# Patient Record
Sex: Female | Born: 1970 | Race: Black or African American | Hispanic: No | Marital: Married | Smoking: Never smoker
Health system: Southern US, Community
[De-identification: ages and names within clinical notes are randomized; demographics above are authoritative.]

## PROBLEM LIST (undated history)

## (undated) DIAGNOSIS — E119 Type 2 diabetes mellitus without complications: Secondary | ICD-10-CM

---

## 2016-08-16 ENCOUNTER — Emergency Department: Payer: Self-pay

## 2016-08-16 ENCOUNTER — Encounter: Payer: Self-pay | Admitting: *Deleted

## 2016-08-16 ENCOUNTER — Emergency Department
Admission: EM | Admit: 2016-08-16 | Discharge: 2016-08-16 | Disposition: A | Discharge status: Home / Self Care | Payer: Self-pay | Attending: Emergency Medicine | Admitting: Emergency Medicine

## 2016-08-16 DIAGNOSIS — E119 Type 2 diabetes mellitus without complications: Secondary | ICD-10-CM | POA: Insufficient documentation

## 2016-08-16 DIAGNOSIS — N1330 Unspecified hydronephrosis: Secondary | ICD-10-CM | POA: Insufficient documentation

## 2016-08-16 DIAGNOSIS — Z7984 Long term (current) use of oral hypoglycemic drugs: Secondary | ICD-10-CM | POA: Insufficient documentation

## 2016-08-16 DIAGNOSIS — N858 Other specified noninflammatory disorders of uterus: Secondary | ICD-10-CM | POA: Insufficient documentation

## 2016-08-16 HISTORY — DX: Type 2 diabetes mellitus without complications: E11.9

## 2016-08-16 LAB — POCT PREGNANCY, URINE: PREG TEST UR: NEGATIVE

## 2016-08-16 LAB — COMPREHENSIVE METABOLIC PANEL
ALBUMIN: 4.4 g/dL (ref 3.5–5.0)
ALT: 11 U/L — ABNORMAL LOW (ref 14–54)
ANION GAP: 12 (ref 5–15)
AST: 26 U/L (ref 15–41)
Alkaline Phosphatase: 68 U/L (ref 38–126)
BUN: 6 mg/dL (ref 6–20)
CHLORIDE: 104 mmol/L (ref 101–111)
CO2: 19 mmol/L — ABNORMAL LOW (ref 22–32)
Calcium: 9.1 mg/dL (ref 8.9–10.3)
Creatinine, Ser: 0.54 mg/dL (ref 0.44–1.00)
GFR calc Af Amer: >60 mL/min (ref >60)
GFR calc non Af Amer: >60 mL/min (ref >60)
GLUCOSE: 184 mg/dL — AB (ref 65–99)
POTASSIUM: 3.9 mmol/L (ref 3.5–5.1)
Sodium: 135 mmol/L (ref 135–145)
TOTAL PROTEIN: 8.1 g/dL (ref 6.5–8.1)
Total Bilirubin: 0.2 mg/dL — ABNORMAL LOW (ref 0.3–1.2)

## 2016-08-16 LAB — URINALYSIS, COMPLETE (UACMP) WITH MICROSCOPIC
BILIRUBIN URINE: NEGATIVE
Glucose, UA: 50 mg/dL — AB
Hgb urine dipstick: NEGATIVE
Ketones, ur: 20 mg/dL — AB
Leukocytes, UA: NEGATIVE
Nitrite: NEGATIVE
PROTEIN: 30 mg/dL — AB
Specific Gravity, Urine: 1.026 (ref 1.005–1.030)
pH: 5 (ref 5.0–8.0)

## 2016-08-16 LAB — CBC
HEMATOCRIT: 32.7 % — AB (ref 35.0–47.0)
HEMOGLOBIN: 10.3 g/dL — AB (ref 12.0–16.0)
MCH: 20.5 pg — AB (ref 26.0–34.0)
MCHC: 31.5 g/dL — ABNORMAL LOW (ref 32.0–36.0)
MCV: 65.1 fL — AB (ref 80.0–100.0)
PLATELETS: 299 10*3/uL (ref 150–440)
RBC: 5.02 MIL/uL (ref 3.80–5.20)
RDW: 20.4 % — ABNORMAL HIGH (ref 11.5–14.5)
WBC: 8.2 10*3/uL (ref 3.6–11.0)

## 2016-08-16 MED ORDER — MORPHINE SULFATE (PF) 4 MG/ML IV SOLN
4.0000 mg | Freq: Once | INTRAVENOUS | Status: AC
  Administered 2016-08-16: 4 mg via INTRAVENOUS
  Filled 2016-08-16: qty 1

## 2016-08-16 MED ORDER — ONDANSETRON HCL 4 MG PO TABS: 4.0000 mg | ORAL_TABLET | Freq: Three times a day (TID) | ORAL | 0 refills | Status: DC | PRN

## 2016-08-16 MED ORDER — ONDANSETRON HCL 4 MG/2ML IJ SOLN
4.0000 mg | Freq: Once | INTRAMUSCULAR | Status: AC
  Administered 2016-08-16: 4 mg via INTRAVENOUS
  Filled 2016-08-16: qty 2

## 2016-08-16 MED ORDER — IOPAMIDOL (ISOVUE-300) INJECTION 61%
75.0000 mL | Freq: Once | INTRAVENOUS | Status: AC | PRN
  Administered 2016-08-16: 75 mL via INTRAVENOUS

## 2016-08-16 MED ORDER — OXYCODONE-ACETAMINOPHEN 5-325 MG PO TABS: 1.0000 | ORAL_TABLET | Freq: Four times a day (QID) | ORAL | 0 refills | Status: AC | PRN

## 2016-08-16 MED ORDER — SODIUM CHLORIDE 0.9 % IV BOLUS (SEPSIS)
1000.0000 mL | Freq: Once | INTRAVENOUS | Status: AC
  Administered 2016-08-16: 1000 mL via INTRAVENOUS

## 2016-08-16 MED ORDER — IOPAMIDOL (ISOVUE-300) INJECTION 61%
30.0000 mL | Freq: Once | INTRAVENOUS | Status: AC
  Administered 2016-08-16: 30 mL via ORAL

## 2016-08-16 NOTE — ED Triage Notes (Signed)
Pt arrives via EMS from home with pain that starts in her thigh and goes up to her right abdomen, states hx of fibroid tumor, states she hasnt seen on obgyn in a year

## 2016-08-16 NOTE — ED Provider Notes (Signed)
[facility] Emergency Department Provider Note  ____________________________________________  Time seen: Approximately 3:03 PM  I have reviewed the triage vital signs and the nursing notes.   HISTORY  Chief Complaint Abdominal Pain   HPI Tricia Marks is a 46 y.o. female with a history of uterine fibroid who presents for evaluation of abdominal pain. Patient reports that she was told a few years ago that she had a large uterine fibroid. She hasn't been seen over a year due to lack of insurance. She has had intermittent left-sided pain which is consistent with her fibroid. She now comes to the emergency room for right-sided abdominal pain that she has had intermittently for a few months. She said she usually is able to take Tylenol at home and the pain gets better however today the pain was severe, sharp, located in the right lower quadrant radiating down to her thigh and constant. She took Tylenol with no relief of the pain. She reports that she had 1 episode of nonbloody nonbilious emesis with this pain. No vaginal discharge. LMP 2 weeks ago. She is status post tubal ligation. She has not had any other abdominal surgeries. No chest pain or shortness of breath, no dysuria or hematuria, no fever or chills.  Past Medical History:  Diagnosis Date  . Diabetes mellitus without complication (HCC)     There are no active problems to display for this patient.   History reviewed. No pertinent surgical history.  Prior to Admission medications   Medication Sig Start Date End Date Taking? Authorizing Provider  diphenhydramine-acetaminophen (TYLENOL PM) 25-500 MG TABS tablet Take 1 tablet by mouth at bedtime as needed.   Yes Historical Provider, MD  metFORMIN (GLUCOPHAGE) 1000 MG tablet Take 1,000 mg by mouth 2 (two) times daily with a meal.   Yes Historical Provider, MD  ondansetron (ZOFRAN) 4 MG tablet Take 1 tablet (4 mg total) by mouth every 8 (eight) hours as  needed for nausea or vomiting. 08/16/16   Nita Sicklearolina Shenika Quint, MD  oxyCODONE-acetaminophen (ROXICET) 5-325 MG tablet Take 1 tablet by mouth every 6 (six) hours as needed. 08/16/16 08/16/17  Nita Sicklearolina Wilma Michaelson, MD    Allergies Compazine [prochlorperazine edisylate]  No family history on file.  Social History Social History  Substance Use Topics  . Smoking status: Never Smoker  . Smokeless tobacco: Never Used  . Alcohol use No    Review of Systems  Constitutional: Negative for fever. Eyes: Negative for visual changes. ENT: Negative for sore throat. Neck: No neck pain  Cardiovascular: Negative for chest pain. Respiratory: Negative for shortness of breath. Gastrointestinal: + R sided abdominal pain and vomiting. No diarrhea. Genitourinary: Negative for dysuria. Musculoskeletal: Negative for back pain. Skin: Negative for rash. Neurological: Negative for headaches, weakness or numbness. Psych: No SI or HI  ____________________________________________   PHYSICAL EXAM:  VITAL SIGNS: ED Triage Vitals [08/16/16 1339]  Enc Vitals Group     BP 122/69     Pulse Rate (!) 120     Resp 18     Temp 98.4 F (36.9 C)     Temp Source Oral     SpO2 100 %     Weight 114 lb (51.7 kg)     Height 5\' 6"  (1.676 m)     Head Circumference      Peak Flow      Pain Score 10     Pain Loc      Pain Edu?      Excl. in  GC?     Constitutional: Alert and oriented. Well appearing and in no apparent distress. HEENT:      Head: Normocephalic and atraumatic.         Eyes: Conjunctivae are normal. Sclera is non-icteric. EOMI. PERRL      Mouth/Throat: Mucous membranes are moist.       Neck: Supple with no signs of meningismus. Cardiovascular: tachycardic with regular rhythm. No murmurs, gallops, or rubs. 2+ symmetrical distal pulses are present in all extremities. No JVD. Respiratory: Normal respiratory effort. Lungs are clear to auscultation bilaterally. No wheezes, crackles, or rhonchi.    Gastrointestinal: Soft, Large mass palpable in the lower abdomen with significant ttp over the RLQ with localized guarding, no rebound Genitourinary: No CVA tenderness. Musculoskeletal: Nontender with normal range of motion in all extremities. No edema, cyanosis, or erythema of extremities. Neurologic: Normal speech and language. Face is symmetric. Moving all extremities. No gross focal neurologic deficits are appreciated. Skin: Skin is warm, dry and intact. No rash noted. Psychiatric: Mood and affect are normal. Speech and behavior are normal.  ____________________________________________   LABS (all labs ordered are listed, but only abnormal results are displayed)  Labs Reviewed  COMPREHENSIVE METABOLIC PANEL - Abnormal; Notable for the following:       Result Value   CO2 19 (*)    Glucose, Bld 184 (*)    ALT 11 (*)    Total Bilirubin 0.2 (*)    All other components within normal limits  CBC - Abnormal; Notable for the following:    Hemoglobin 10.3 (*)    HCT 32.7 (*)    MCV 65.1 (*)    MCH 20.5 (*)    MCHC 31.5 (*)    RDW 20.4 (*)    All other components within normal limits  URINALYSIS, COMPLETE (UACMP) WITH MICROSCOPIC - Abnormal; Notable for the following:    Color, Urine YELLOW (*)    APPearance CLEAR (*)    Glucose, UA 50 (*)    Ketones, ur 20 (*)    Protein, ur 30 (*)    All other components within normal limits  POCT PREGNANCY, URINE   ____________________________________________  EKG  none  ____________________________________________  RADIOLOGY  CT a/p: 1. 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 2. Mild bilateral hydronephrosis, probably due to mass effect from the enlarged uterus. 3. Small volume of pelvic ascites. ____________________________________________   PROCEDURES  Procedure(s) performed: None Procedures Critical Care performed:   None ____________________________________________   INITIAL IMPRESSION / ASSESSMENT AND PLAN / ED COURSE  46 y.o. female with a history of uterine fibroid who presents for evaluation of R sided abdominal pain. Patient is in mild distress due to pain, vital signs show tachycardia with pulse of 120 otherwise within normal limits. Physical exam showing a large palpable mass in the lower part of the abdomen with localized guarding in the right lower quadrant and no rebound. Will get CT a/p to eval for etiology of the pain and mass. Lab showing anemia with hgb 10.3, normal CMP. Will give IVF, IV zofran, and IV morphine for symptoms.  Clinical Course as of Aug 16 1750  Fri Aug 16, 2016  1745 CT showing 18 cm mass in her uterus concerning for possible myomas. Patient also has mild hydronephrosis with normal kidney function and no evidence of urinary tract infection. I discussed with Dr. Chauncey Cruel, OB/GYN on call who recommended outpatient follow-up so patient can undergo appropriate workup prior  to surgery including Pap smear, and evaluation for cervical malignancy. Patient's vital signs are improved after IV fluids and pain medication. She is going to be discharged home with Percocet and referral to OB/GYN.  [CV]    Clinical Course User Index [CV] Nita Sickle, MD    Pertinent labs & imaging results that were available during my care of the patient were reviewed by me and considered in my medical decision making (see chart for details).    ____________________________________________   FINAL CLINICAL IMPRESSION(S) / ED DIAGNOSES  Final diagnoses:  Uterine mass  Hydronephrosis, unspecified hydronephrosis type      NEW MEDICATIONS STARTED DURING THIS VISIT:  New Prescriptions   ONDANSETRON (ZOFRAN) 4 MG TABLET    Take 1 tablet (4 mg total) by mouth every 8 (eight) hours as needed for nausea or vomiting.   OXYCODONE-ACETAMINOPHEN (ROXICET) 5-325 MG TABLET    Take 1 tablet by mouth every  6 (six) hours as needed.     Note:  This document was prepared using Dragon voice recognition software and may include unintentional dictation errors.    Nita Sickle, MD 08/16/16 [phone]

## 2016-08-16 NOTE — Discharge Instructions (Signed)
Your CT showed a large mass in your uterus. You need to follow-up with OB/GYN within the next week for further evaluation and to determine if you need surgery to remove this mass. At this time it seems like the mass is not cancer but we are unable to determine that until you see OB/GYN. Make sure to call Dr. Rinaldo RatelStabler's office tomorrow first thing in the morning for close follow-up appointment. Return to the emergency room if you have new or worsening abdominal pain or any new symptoms concerning to you.

## 2016-08-16 NOTE — ED Notes (Signed)
Pt assisted to bathroom

## 2016-08-23 ENCOUNTER — Encounter: Payer: Self-pay | Admitting: Obstetrics & Gynecology

## 2016-09-04 ENCOUNTER — Encounter: Payer: Self-pay | Admitting: Obstetrics and Gynecology

## 2018-12-03 ENCOUNTER — Emergency Department: Payer: No Typology Code available for payment source

## 2018-12-03 ENCOUNTER — Emergency Department
Admission: EM | Admit: 2018-12-03 | Discharge: 2018-12-04 | Disposition: A | Discharge status: Home / Self Care | Payer: No Typology Code available for payment source | Attending: Emergency Medicine | Admitting: Emergency Medicine

## 2018-12-03 ENCOUNTER — Encounter: Payer: Self-pay | Admitting: Emergency Medicine

## 2018-12-03 ENCOUNTER — Other Ambulatory Visit: Payer: Self-pay

## 2018-12-03 DIAGNOSIS — Y9389 Activity, other specified: Secondary | ICD-10-CM | POA: Diagnosis not present

## 2018-12-03 DIAGNOSIS — Y92410 Unspecified street and highway as the place of occurrence of the external cause: Secondary | ICD-10-CM | POA: Diagnosis not present

## 2018-12-03 DIAGNOSIS — Z7984 Long term (current) use of oral hypoglycemic drugs: Secondary | ICD-10-CM | POA: Diagnosis not present

## 2018-12-03 DIAGNOSIS — S199XXA Unspecified injury of neck, initial encounter: Secondary | ICD-10-CM | POA: Diagnosis present

## 2018-12-03 DIAGNOSIS — Y999 Unspecified external cause status: Secondary | ICD-10-CM | POA: Insufficient documentation

## 2018-12-03 DIAGNOSIS — S161XXA Strain of muscle, fascia and tendon at neck level, initial encounter: Secondary | ICD-10-CM | POA: Diagnosis not present

## 2018-12-03 DIAGNOSIS — E119 Type 2 diabetes mellitus without complications: Secondary | ICD-10-CM | POA: Diagnosis not present

## 2018-12-03 DIAGNOSIS — S80811A Abrasion, right lower leg, initial encounter: Secondary | ICD-10-CM | POA: Insufficient documentation

## 2018-12-03 DIAGNOSIS — M79604 Pain in right leg: Secondary | ICD-10-CM | POA: Diagnosis not present

## 2018-12-03 DIAGNOSIS — S80812A Abrasion, left lower leg, initial encounter: Secondary | ICD-10-CM

## 2018-12-03 MED ORDER — CYCLOBENZAPRINE HCL 10 MG PO TABS: 10.0000 mg | ORAL_TABLET | Freq: Three times a day (TID) | ORAL | 0 refills | Status: AC | PRN

## 2018-12-03 MED ORDER — DIAZEPAM 5 MG PO TABS
5.0000 mg | ORAL_TABLET | Freq: Once | ORAL | Status: AC
  Administered 2018-12-03: 5 mg via ORAL
  Filled 2018-12-03: qty 1

## 2018-12-03 MED ORDER — BACITRACIN-NEOMYCIN-POLYMYXIN OINTMENT TUBE
TOPICAL_OINTMENT | Freq: Once | CUTANEOUS | Status: AC
  Administered 2018-12-03: 21:00:00 via TOPICAL
  Filled 2018-12-03: qty 14.17

## 2018-12-03 MED ORDER — NAPROXEN 500 MG PO TABS: 500.0000 mg | ORAL_TABLET | Freq: Two times a day (BID) | ORAL | 0 refills | Status: AC

## 2018-12-03 MED ORDER — HYDROCODONE-ACETAMINOPHEN 5-325 MG PO TABS
1.0000 | ORAL_TABLET | Freq: Once | ORAL | Status: AC
  Administered 2018-12-03: 1 via ORAL
  Filled 2018-12-03: qty 1

## 2018-12-03 NOTE — Discharge Instructions (Addendum)
Please follow-up with the primary care provider of your choice for symptoms that are not improving over the week.  Return to the emergency department for symptoms of concern if you are unable to schedule an appointment.
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
## 2018-12-03 NOTE — ED Notes (Signed)
Pt abjectly refuses to provide urine for any testing whatsoever.

## 2018-12-03 NOTE — ED Provider Notes (Signed)
Apolonio Schneiders, attending physician, personally viewed and interpreted this EKG  EKG Time: 1855 Rate: 157 Rhythm: sinus tachycardia Axis: normal Intervals: qtc 394 QRS: narrow, q waves v1, v2 ST changes: no st elevation Impression: abnormal Val Eagle, MD 12/03/18 1858
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
## 2023-04-18 DIAGNOSIS — E119 Type 2 diabetes mellitus without complications: Secondary | ICD-10-CM | POA: Diagnosis not present

## 2023-04-18 DIAGNOSIS — E559 Vitamin D deficiency, unspecified: Secondary | ICD-10-CM | POA: Diagnosis not present

## 2023-07-11 ENCOUNTER — Other Ambulatory Visit: Payer: Self-pay

## 2023-07-11 DIAGNOSIS — E1165 Type 2 diabetes mellitus with hyperglycemia: Secondary | ICD-10-CM | POA: Insufficient documentation

## 2023-07-11 DIAGNOSIS — R739 Hyperglycemia, unspecified: Secondary | ICD-10-CM | POA: Diagnosis present

## 2023-07-11 LAB — CBC
HCT: 38.5 % (ref 36.0–46.0)
Hemoglobin: 12.7 g/dL (ref 12.0–15.0)
MCH: 26.1 pg (ref 26.0–34.0)
MCHC: 33 g/dL (ref 30.0–36.0)
MCV: 79.1 fL — ABNORMAL LOW (ref 80.0–100.0)
Platelets: 271 10*3/uL (ref 150–400)
RBC: 4.87 MIL/uL (ref 3.87–5.11)
RDW: 16.5 % — ABNORMAL HIGH (ref 11.5–15.5)
WBC: 8.2 10*3/uL (ref 4.0–10.5)
nRBC: 0 % (ref 0.0–0.2)

## 2023-07-11 LAB — BASIC METABOLIC PANEL
Anion gap: 14 (ref 5–15)
BUN: 9 mg/dL (ref 6–20)
CO2: 22 mmol/L (ref 22–32)
Calcium: 9.5 mg/dL (ref 8.9–10.3)
Chloride: 104 mmol/L (ref 98–111)
Creatinine, Ser: 0.59 mg/dL (ref 0.44–1.00)
GFR, Estimated: >60 mL/min (ref >60)
Glucose, Bld: 110 mg/dL — ABNORMAL HIGH (ref 70–99)
Potassium: 4.2 mmol/L (ref 3.5–5.1)
Sodium: 140 mmol/L (ref 135–145)

## 2023-07-11 LAB — CBG MONITORING, ED: Glucose-Capillary: 101 mg/dL — ABNORMAL HIGH (ref 70–99)

## 2023-07-11 NOTE — ED Provider Triage Note (Signed)
Emergency Medicine Provider Triage Evaluation Note  Tricia Marks , a 53 y.o. female  was evaluated in triage.  Pt complains of hyperglycemia. Her monitor read 374. She took extra Metformin and glipizide about 3 hours ago and now feels like it is going down fast.   Physical Exam  BP 132/74 (BP Location: Right Arm)   Pulse (!) 110   Temp 98 F (36.7 C) (Oral)   Resp 16   SpO2 100%  Gen:   Awake, no distress   Resp:  Normal effort  MSK:   Moves extremities without difficulty  Other:    Medical Decision Making  Medically screening exam initiated at 10:36 PM.  Appropriate orders placed.  Tricia Marks was informed that the remainder of the evaluation will be completed by another provider, this initial triage assessment does not replace that evaluation, and the importance of remaining in the ED until their evaluation is complete.    Chinita Pester, FNP 07/11/23 2240

## 2023-07-11 NOTE — ED Triage Notes (Signed)
Pt to ed from home via POV for hyperglycemia. BGL at home was 374. Pt took an extra dose of 1000mg  of Metformin when she realized her BGL was too high. She also took an extra glipizide to and now her BGL is coming down too fast. Pt is caox4, in no acute distress and ambulatory in triage,.

## 2023-07-12 ENCOUNTER — Emergency Department
Admission: EM | Admit: 2023-07-12 | Discharge: 2023-07-12 | Disposition: A | Discharge status: Home / Self Care | Payer: Medicaid Other | Attending: Emergency Medicine | Admitting: Emergency Medicine

## 2023-07-12 DIAGNOSIS — R739 Hyperglycemia, unspecified: Secondary | ICD-10-CM

## 2023-07-12 LAB — CBG MONITORING, ED
Glucose-Capillary: 150 mg/dL — ABNORMAL HIGH (ref 70–99)
Glucose-Capillary: 182 mg/dL — ABNORMAL HIGH (ref 70–99)

## 2023-07-12 NOTE — ED Provider Notes (Signed)
[facility] Provider Note    Event Date/Time   First MD Initiated Contact with Patient 07/12/23 0222     (approximate)   History   Chief Complaint Hyperglycemia   HPI  Tricia Marks is a 53 y.o. female with past medical history of diabetes who presents to the ED complaining of hyperglycemia.  Patient reports that earlier today her blood sugar was running high, during which time she was getting readings in the 400s as well as reading that only said "high."  She states that she took an extra dose of metformin as well as extra dose of glipizide to help with this, then found that her blood sugar was dropping significantly.  She states it got down into the 70s and she became concerned, so she called EMS.  She denies any symptoms with this, currently states that she feels well.  She denies any fevers, cough, chest pain, shortness of breath, vomiting, diarrhea, or dysuria.     Physical Exam   Triage Vital Signs: ED Triage Vitals  Encounter Vitals Group     BP 07/11/23 2234 132/74     Systolic BP Percentile --      Diastolic BP Percentile --      Pulse Rate 07/11/23 2234 (!) 110     Resp 07/11/23 2234 16     Temp 07/11/23 2234 98 F (36.7 C)     Temp Source 07/11/23 2234 Oral     SpO2 07/11/23 2221 100 %     Weight --      Height --      Head Circumference --      Peak Flow --      Pain Score 07/11/23 2235 0     Pain Loc --      Pain Education --      Exclude from Growth Chart --     Most recent vital signs: Vitals:   07/11/23 2234 07/12/23 0210  BP: 132/74 117/64  Pulse: (!) 110 99  Resp: 16 16  Temp: 98 F (36.7 C) 98.6 F (37 C)  SpO2: 100% 100%    Constitutional: Alert and oriented. Eyes: Conjunctivae are normal. Head: Atraumatic. Nose: No congestion/rhinnorhea. Mouth/Throat: Mucous membranes are moist.  Cardiovascular: Normal rate, regular rhythm. Grossly normal heart sounds.  2+ radial pulses bilaterally. Respiratory: Normal  respiratory effort.  No retractions. Lungs CTAB. Gastrointestinal: Soft and nontender. No distention. Musculoskeletal: No lower extremity tenderness nor edema.  Neurologic:  Normal speech and language. No gross focal neurologic deficits are appreciated.    ED Results / Procedures / Treatments   Labs (all labs ordered are listed, but only abnormal results are displayed) Labs Reviewed  CBC - Abnormal; Notable for the following components:      Result Value   MCV 79.1 (*)    RDW 16.5 (*)    All other components within normal limits  BASIC METABOLIC PANEL - Abnormal; Notable for the following components:   Glucose, Bld 110 (*)    All other components within normal limits  CBG MONITORING, ED - Abnormal; Notable for the following components:   Glucose-Capillary 101 (*)    All other components within normal limits  CBG MONITORING, ED - Abnormal; Notable for the following components:   Glucose-Capillary 150 (*)    All other components within normal limits  CBG MONITORING, ED - Abnormal; Notable for the following components:   Glucose-Capillary 182 (*)    All other components within normal limits  PROCEDURES:  Critical Care performed: No  Procedures   MEDICATIONS ORDERED IN ED: Medications - No data to display   IMPRESSION / MDM / ASSESSMENT AND PLAN / ED COURSE  I reviewed the triage vital signs and the nursing notes.                              53 y.o. female with past medical history of diabetes who presents to the ED complaining of elevated blood sugar at home, which she took an extra dose of her medication for.  Patient's presentation is most consistent with acute presentation with potential threat to life or bodily function.  Differential diagnosis includes, but is not limited to, hyperglycemia, DKA, electrolyte abnormality, AKI, hypoglycemia.  Patient nontoxic-appearing and in no acute distress, vital signs are unremarkable.  Patient currently denies any  complaints, blood sugar improved after patient took extra dose of medication.  No significant anemia, leukocytosis, electrolyte abnormality, or AKI.  She is at risk for hypoglycemia with extra dose of glipizide, however she remains normoglycemic on multiple rechecks and observation for almost 5 hours.  No symptoms to suggest infectious process and patient appropriate for discharge home with outpatient follow-up.  She was counseled to return to the ED for new or worsening symptoms, patient agrees with plan.      FINAL CLINICAL IMPRESSION(S) / ED DIAGNOSES   Final diagnoses:  Hyperglycemia     Rx / DC Orders   ED Discharge Orders     None        Note:  This document was prepared using Dragon voice recognition software and may include unintentional dictation errors.   Chesley Noon, MD 07/12/23 [phone]

## 2023-07-18 ENCOUNTER — Telehealth: Payer: Self-pay | Admitting: *Deleted

## 2023-07-18 NOTE — Progress Notes (Signed)
Transition Care Management Unsuccessful Follow-up Telephone Call  Date of discharge and from where:  [facility]  07/12/2023  Attempts:  1st Attempt  Reason for unsuccessful TCM follow-up call:  No answer/busy

## 2023-07-21 ENCOUNTER — Telehealth: Payer: Self-pay | Admitting: *Deleted

## 2023-07-21 NOTE — Progress Notes (Signed)
Transition Care Management Follow-up Telephone Call Date of discharge and from where: [facility] 07/12/2023 How have you been since you were released from the hospital? Feeling better has seen dr no issues with transportation or medication Any questions or concerns? No  Items Reviewed: Did the pt receive and understand the discharge instructions provided? Yes Medications obtained and verified? Yes  Other? No  Any new allergies since your discharge? No  Dietary orders reviewed? No Do you have support at home? Yes     Follow up appointments reviewed:  PCP Hospital f/u appt confirmed? Yes  Saw Dr on Friday and she is going to see a nutritionist   Are transportation arrangements needed? No  If their condition worsens, is the pt aware to call PCP or go to the Emergency Dept.? Yes Was the patient provided with contact information for the PCP's office or ED? Yes Was to pt encouraged to call back with questions or concerns? Yes  Dione Booze  [facility] HealthPopulation Health Care Guide  Direct Dial:[phone] Fax:[phone] Website: [email or web address]
